# Patient Record
Sex: Female | Born: 1975 | Race: Black or African American | Hispanic: No | Marital: Single | Smoking: Never smoker
Health system: Southern US, Community
[De-identification: ages and names within clinical notes are randomized; demographics above are authoritative.]

## PROBLEM LIST (undated history)

## (undated) DIAGNOSIS — A549 Gonococcal infection, unspecified: Secondary | ICD-10-CM

## (undated) DIAGNOSIS — B009 Herpesviral infection, unspecified: Secondary | ICD-10-CM

## (undated) DIAGNOSIS — F3281 Premenstrual dysphoric disorder: Secondary | ICD-10-CM

## (undated) HISTORY — PX: OTHER SURGICAL HISTORY: SHX169

## (undated) HISTORY — PX: WISDOM TOOTH EXTRACTION: SHX21

## (undated) HISTORY — PX: CHOLECYSTECTOMY: SHX55

## (undated) HISTORY — DX: Premenstrual dysphoric disorder: F32.81

## (undated) HISTORY — PX: MOUTH SURGERY: SHX715

## (undated) HISTORY — DX: Gonococcal infection, unspecified: A54.9

## (undated) HISTORY — DX: Herpesviral infection, unspecified: B00.9

---

## 1997-09-25 ENCOUNTER — Emergency Department (HOSPITAL_COMMUNITY): Admission: EM | Admit: 1997-09-25 | Discharge: 1997-09-25 | Payer: Self-pay | Admitting: Family Medicine

## 1997-11-17 ENCOUNTER — Emergency Department (HOSPITAL_COMMUNITY): Admission: EM | Admit: 1997-11-17 | Discharge: 1997-11-17 | Payer: Self-pay | Admitting: Emergency Medicine

## 1997-12-14 ENCOUNTER — Emergency Department (HOSPITAL_COMMUNITY): Admission: EM | Admit: 1997-12-14 | Discharge: 1997-12-14 | Payer: Self-pay | Admitting: Emergency Medicine

## 1998-03-10 ENCOUNTER — Emergency Department (HOSPITAL_COMMUNITY): Admission: EM | Admit: 1998-03-10 | Discharge: 1998-03-10 | Payer: Self-pay | Admitting: Emergency Medicine

## 1998-07-30 ENCOUNTER — Inpatient Hospital Stay (HOSPITAL_COMMUNITY): Admission: AD | Admit: 1998-07-30 | Discharge: 1998-07-30 | Payer: Self-pay | Admitting: *Deleted

## 1998-08-29 ENCOUNTER — Emergency Department (HOSPITAL_COMMUNITY): Admission: EM | Admit: 1998-08-29 | Discharge: 1998-08-29 | Payer: Self-pay

## 1999-01-11 ENCOUNTER — Emergency Department (HOSPITAL_COMMUNITY): Admission: EM | Admit: 1999-01-11 | Discharge: 1999-01-11 | Payer: Self-pay | Admitting: Emergency Medicine

## 1999-01-11 ENCOUNTER — Encounter: Payer: Self-pay | Admitting: Emergency Medicine

## 2001-10-04 ENCOUNTER — Inpatient Hospital Stay (HOSPITAL_COMMUNITY): Admission: EM | Admit: 2001-10-04 | Discharge: 2001-10-06 | Payer: Self-pay | Admitting: Psychiatry

## 2003-04-22 ENCOUNTER — Emergency Department (HOSPITAL_COMMUNITY): Admission: EM | Admit: 2003-04-22 | Discharge: 2003-04-23 | Payer: Self-pay | Admitting: Emergency Medicine

## 2003-11-15 ENCOUNTER — Other Ambulatory Visit: Admission: RE | Admit: 2003-11-15 | Discharge: 2003-11-15 | Payer: Self-pay | Admitting: Obstetrics and Gynecology

## 2004-01-13 ENCOUNTER — Ambulatory Visit (HOSPITAL_COMMUNITY): Admission: RE | Admit: 2004-01-13 | Discharge: 2004-01-14 | Payer: Self-pay | Admitting: General Surgery

## 2004-01-13 ENCOUNTER — Encounter (INDEPENDENT_AMBULATORY_CARE_PROVIDER_SITE_OTHER): Payer: Self-pay | Admitting: *Deleted

## 2004-11-16 ENCOUNTER — Other Ambulatory Visit: Admission: RE | Admit: 2004-11-16 | Discharge: 2004-11-16 | Payer: Self-pay | Admitting: Obstetrics and Gynecology

## 2005-11-17 ENCOUNTER — Other Ambulatory Visit: Admission: RE | Admit: 2005-11-17 | Discharge: 2005-11-17 | Payer: Self-pay | Admitting: Obstetrics and Gynecology

## 2006-11-01 ENCOUNTER — Encounter (INDEPENDENT_AMBULATORY_CARE_PROVIDER_SITE_OTHER): Payer: Self-pay | Admitting: Nurse Practitioner

## 2006-11-01 ENCOUNTER — Ambulatory Visit: Payer: Self-pay | Admitting: Internal Medicine

## 2006-11-01 ENCOUNTER — Ambulatory Visit: Payer: Self-pay | Admitting: *Deleted

## 2006-11-01 LAB — CONVERTED CEMR LAB
ALT: 12 units/L (ref 0–35)
BUN: 14 mg/dL (ref 6–23)
Basophils Absolute: 0 10*3/uL (ref 0.0–0.1)
CO2: 24 meq/L (ref 19–32)
Calcium: 9 mg/dL (ref 8.4–10.5)
Chloride: 104 meq/L (ref 96–112)
Cholesterol: 190 mg/dL (ref 0–200)
Creatinine, Ser: 0.9 mg/dL (ref 0.40–1.20)
Eosinophils Relative: 1 % (ref 0–5)
Glucose, Bld: 79 mg/dL (ref 70–99)
HCT: 39.6 % (ref 36.0–46.0)
Hemoglobin: 12.1 g/dL (ref 12.0–15.0)
Lymphocytes Relative: 19 % (ref 12–46)
Lymphs Abs: 1.4 10*3/uL (ref 0.7–3.3)
Monocytes Absolute: 0.5 10*3/uL (ref 0.2–0.7)
Monocytes Relative: 6 % (ref 3–11)
Neutro Abs: 5.7 10*3/uL (ref 1.7–7.7)
RBC: 4.37 M/uL (ref 3.87–5.11)
Total CHOL/HDL Ratio: 3
Triglycerides: 63 mg/dL (ref <150)
WBC: 7.7 10*3/uL (ref 4.0–10.5)

## 2006-12-05 ENCOUNTER — Ambulatory Visit: Payer: Self-pay | Admitting: Internal Medicine

## 2007-02-08 ENCOUNTER — Ambulatory Visit: Payer: Self-pay | Admitting: Family Medicine

## 2007-04-28 ENCOUNTER — Ambulatory Visit (HOSPITAL_COMMUNITY): Admission: RE | Admit: 2007-04-28 | Discharge: 2007-04-28 | Payer: Self-pay | Admitting: Nurse Practitioner

## 2007-05-30 ENCOUNTER — Ambulatory Visit: Payer: Self-pay | Admitting: Internal Medicine

## 2007-05-30 ENCOUNTER — Encounter (INDEPENDENT_AMBULATORY_CARE_PROVIDER_SITE_OTHER): Payer: Self-pay | Admitting: Nurse Practitioner

## 2007-05-30 LAB — CONVERTED CEMR LAB
Basophils Absolute: 0 10*3/uL (ref 0.0–0.1)
CO2: 24 meq/L (ref 19–32)
Cholesterol: 199 mg/dL (ref 0–200)
Creatinine, Ser: 0.9 mg/dL (ref 0.40–1.20)
Eosinophils Absolute: 0.1 10*3/uL (ref 0.0–0.7)
Eosinophils Relative: 1 % (ref 0–5)
Glucose, Bld: 72 mg/dL (ref 70–99)
HCT: 40.1 % (ref 36.0–46.0)
Hemoglobin: 12.8 g/dL (ref 12.0–15.0)
Lymphocytes Relative: 19 % (ref 12–46)
Lymphs Abs: 1.9 10*3/uL (ref 0.7–4.0)
MCV: 89.3 fL (ref 78.0–100.0)
Monocytes Absolute: 0.6 10*3/uL (ref 0.1–1.0)
RDW: 13.5 % (ref 11.5–15.5)
Total Bilirubin: 0.4 mg/dL (ref 0.3–1.2)
Total CHOL/HDL Ratio: 2.7
Triglycerides: 103 mg/dL (ref <150)
VLDL: 21 mg/dL (ref 0–40)

## 2007-07-26 ENCOUNTER — Ambulatory Visit: Payer: Self-pay | Admitting: Family Medicine

## 2007-07-26 ENCOUNTER — Encounter (INDEPENDENT_AMBULATORY_CARE_PROVIDER_SITE_OTHER): Payer: Self-pay | Admitting: Nurse Practitioner

## 2008-03-22 ENCOUNTER — Ambulatory Visit: Payer: Self-pay | Admitting: Internal Medicine

## 2008-03-22 ENCOUNTER — Encounter (INDEPENDENT_AMBULATORY_CARE_PROVIDER_SITE_OTHER): Payer: Self-pay | Admitting: Internal Medicine

## 2008-03-22 LAB — CONVERTED CEMR LAB
ALT: 9 units/L (ref 0–35)
BUN: 14 mg/dL (ref 6–23)
CO2: 23 meq/L (ref 19–32)
Calcium: 9.3 mg/dL (ref 8.4–10.5)
Chloride: 104 meq/L (ref 96–112)
Creatinine, Ser: 0.8 mg/dL (ref 0.40–1.20)
GC Probe Amp, Genital: NEGATIVE
Glucose, Bld: 86 mg/dL (ref 70–99)

## 2008-06-15 ENCOUNTER — Emergency Department (HOSPITAL_COMMUNITY): Admission: EM | Admit: 2008-06-15 | Discharge: 2008-06-15 | Payer: Self-pay | Admitting: Emergency Medicine

## 2008-06-24 ENCOUNTER — Ambulatory Visit: Payer: Self-pay | Admitting: Internal Medicine

## 2008-06-24 ENCOUNTER — Encounter: Payer: Self-pay | Admitting: Internal Medicine

## 2008-06-24 DIAGNOSIS — R21 Rash and other nonspecific skin eruption: Secondary | ICD-10-CM | POA: Insufficient documentation

## 2008-07-16 ENCOUNTER — Ambulatory Visit: Payer: Self-pay | Admitting: Internal Medicine

## 2008-07-19 ENCOUNTER — Ambulatory Visit: Payer: Self-pay | Admitting: Internal Medicine

## 2008-08-21 ENCOUNTER — Ambulatory Visit: Payer: Self-pay | Admitting: Internal Medicine

## 2008-09-13 ENCOUNTER — Ambulatory Visit: Payer: Self-pay | Admitting: Internal Medicine

## 2008-09-17 ENCOUNTER — Ambulatory Visit: Payer: Self-pay | Admitting: Internal Medicine

## 2008-09-19 ENCOUNTER — Ambulatory Visit: Payer: Self-pay | Admitting: Internal Medicine

## 2008-09-29 ENCOUNTER — Emergency Department (HOSPITAL_COMMUNITY): Admission: EM | Admit: 2008-09-29 | Discharge: 2008-09-29 | Payer: Self-pay | Admitting: Emergency Medicine

## 2008-10-11 ENCOUNTER — Emergency Department (HOSPITAL_COMMUNITY): Admission: EM | Admit: 2008-10-11 | Discharge: 2008-10-11 | Payer: Self-pay | Admitting: Family Medicine

## 2008-10-16 ENCOUNTER — Telehealth (INDEPENDENT_AMBULATORY_CARE_PROVIDER_SITE_OTHER): Payer: Self-pay | Admitting: *Deleted

## 2008-10-17 ENCOUNTER — Ambulatory Visit: Payer: Self-pay | Admitting: Obstetrics and Gynecology

## 2008-11-21 ENCOUNTER — Ambulatory Visit: Payer: Self-pay | Admitting: Internal Medicine

## 2008-11-21 ENCOUNTER — Telehealth (INDEPENDENT_AMBULATORY_CARE_PROVIDER_SITE_OTHER): Payer: Self-pay | Admitting: *Deleted

## 2009-01-17 ENCOUNTER — Ambulatory Visit: Payer: Self-pay | Admitting: Internal Medicine

## 2009-01-17 ENCOUNTER — Encounter (INDEPENDENT_AMBULATORY_CARE_PROVIDER_SITE_OTHER): Payer: Self-pay | Admitting: Internal Medicine

## 2009-01-17 LAB — CONVERTED CEMR LAB

## 2010-05-28 ENCOUNTER — Inpatient Hospital Stay (HOSPITAL_COMMUNITY): Admission: RE | Admit: 2010-05-28 | Payer: Managed Care, Other (non HMO) | Source: Ambulatory Visit

## 2010-07-14 NOTE — Group Therapy Note (Signed)
NAME:  [name], [name] NO.:  [id number]   MEDICAL RECORD NO.:  [id number]          PATIENT TYPE:  WOC   LOCATION:  [facility]                   FACILITY:  [facility]   PHYSICIAN:  Argentina Donovan, MD        DATE OF BIRTH:  [birth year]   DATE OF SERVICE:  10/17/2008                                  CLINIC NOTE   OFFICE VISIT:  At [facility].   PHYSICAL EXAMINATION:  VITAL SIGNS:  Temperature 98.8, pulse 81, blood  pressure 128/77, weight 294.4 pounds, and height 5 feet 7 inches.   REASON FOR VISIT:  The patient was referred by [facility] for a  possible cervical polyp.   The patient was seen at [facility] on March 22, 2008.  At that time,  a possible polyp was seen on the cervix at 12 o'clock.  The patient was  referred here for further evaluation.  She had a Pap smear on Jul 19, 2008, that was negative for intraepithelial lesion or malignancy.  Here  she has no complaint of pain with intercourse or vaginal discharge.  Menses have been regular, lasting usually for 5 days and heavy.  The  patient just started on Desogen birth control pill in July and her last  menstrual period is today, October 17, 2008 she just started her period.  She is sexually active with 1 partner since May 2010 and they are using  condoms consistently.   On exam, her cervix looks normal without any redness or inflammation.  She is on her period and there is some blood seen on exam.  Of note, at  12 o'clock position, there is a flesh-colored lesion that may be  considered a polyp or a sort of cervical lesion that looks benign.  This  area is not tender to palpation, is not friable, does not bleed on  palpation.  At this time, we have determined that the lesions are benign  and does not need further workup unless she starts to have some sort of  complaints for this.  Given that she did have a normal Pap smear done 3  months ago, we feel comfortable that this is a benign lesion.   The  patient is instructed to return to [facility] for further concerns or  she can call the [facility] here with further concerns.      Sid Falcon, CNM    ______________________________  Argentina Donovan, MD    WM/MEDQ  D:  10/17/2008  T:  10/18/2008  Job:  [id number]

## 2010-07-17 NOTE — Discharge Summary (Signed)
NAMENORELLE, RUNNION                ACCOUNT NO.:  [id number]   MEDICAL RECORD NO.:  [id number]          PATIENT TYPE:  OIB   LOCATION:  5704                         FACILITY:  [facility]   PHYSICIAN:  Gabrielle Dare. Janee Morn, M.D.DATE OF BIRTH:  1975/05/05   DATE OF ADMISSION:  01/13/2004  DATE OF DISCHARGE:  01/14/2004                                 DISCHARGE SUMMARY   DISCHARGE DIAGNOSES:  1.  Symptomatic cholelithiasis.  2.  Status post laparoscopic cholecystectomy with intraoperative      cholangiogram.   HISTORY OF PRESENT ILLNESS:  The patient is a 35 year old African American  female with symptomatic cholelithiasis who presented for elective  cholecystectomy.   HOSPITAL COURSE:  The patient underwent an uncomplicated laparoscopic  cholecystectomy with intraoperative cholangiogram.  Her cholangiogram  demonstrated no filling defects in the common bile duct.  Her common bile  duct was generous in size.  Postoperatively, she tolerated gradual  advancement of her diet.  She remained afebrile and hemodynamically stable  and had good pain control, and she is discharged home in stable condition on  postoperative day #1.   DISCHARGE INSTRUCTIONS:  1.  Diet:  Low-fat.  2.  Activity:  No lifting.   DISCHARGE MEDICATIONS:  1.  Percocet 5/325 1-2 q.6h. p.r.n. pain.  2.  Continue outpatient medications including:      1.  Phentermine 15 mg p.o. daily.      2.  Lasix 40 mg p.o. daily.   FOLLOWUP:  Follow up is in 3 weeks with myself.       BET/MEDQ  D:  01/14/2004  T:  01/14/2004  Job:  [id number]

## 2010-07-17 NOTE — H&P (Signed)
   [name], [name]                          ACCOUNT NO.:  [id number]   MEDICAL RECORD NO.:  [id number]                   PATIENT TYPE:  IPS   LOCATION:  0504                                 [facility]ILITY[facility]   PHYSICIAN:  Jeanice Lim, MD                DATE OF BIRTH:  10-09-1975   DATE OF ADMISSION:  10/04/2001  DATE OF DISCHARGE:  10/06/2001                         PSYCHIATRIC ADMISSION ASSESSMENT   IDENTIFYING INFORMATION:  This is a 35 year old African-American female, who  is a voluntary admission.   HISTORY OF PRESENT ILLNESS:  This patient drove himself to [facility] on her way home from work after a crisis with her  boyfriend coworker, who was emailing another woman.  The patient became  agitated, upset and was feeling betrayed by the boyfriend.  She called a  Veterinary surgeon, who suggested that she come here for assessment.  She has been  feeling angry with her boyfriend and talked of whipping up on one of them  but denies that she ever had any true homicidal intent or any suicidal  intent.  She denies any symptoms of depression.   Dictation ended at this point.     [name]                   Jeanice Lim, MD    MAS/MEDQ  D:  11/14/2001  T:  11/15/2001  Job:  [id number]

## 2010-07-17 NOTE — H&P (Signed)
[name], [name]                          ACCOUNT NO.:  [id number]   MEDICAL RECORD NO.:  [id number]                   PATIENT TYPE:  IPS   LOCATION:  0504                                 [facility]   PHYSICIAN:  Viviann Spare, NP                DATE OF BIRTH:  Jul 02, 1975   DATE OF ADMISSION:  10/04/2001  DATE OF DISCHARGE:                         PSYCHIATRIC ADMISSION ASSESSMENT   DATE OF ASSESSMENT:  October 05, 2001.   IDENTIFYING INFORMATION:  This is a 35 year old African-American female who  is a voluntary admission.  Her chief complaint this is a terrible mistake.  I only came here for counseling.  I didn't think I was going to spend the  night.   HISTORY OF PRESENT ILLNESS:  This patient drove herself to [facility] on the way home from work after having a crisis  with her boyfriend coworker, who she found emailing another female employee.  The patient became agitated, upset and was feeling betrayed by the  boyfriend.  They had a relationship, which she considered close after two  years of progressively a more intimate relationship.  She feels that he has  betrayed her, that he is really interested in another woman.  The patient  called her psychotherapist, who suggested that, because she was upset, she  may need to come to [facility].  The patient has admitted, at  the time of admission, to feeling angry at her boyfriend and the other  woman, both of whom are her coworkers and talked of whipping up on one of  them and possibly harming them.  She was not able to promise safety for her  homicidal ideation at the time of admission.  Today, she adamantly denies  any suicidal ideation or homicidal ideation.  She denies any symptoms of  depression.   PAST PSYCHIATRIC HISTORY:  The patient has no history of prior treatment.  She denies any history of violence or temper outbursts or mood swings.  She  has no prior history of  suicide attempts as far as we can tell or substance  abuse.  This is her first psychiatric admission.   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  She was originally from [place], [place] and was  raised the younger of two children.  She reports her grades are  satisfactory.  No history of behavior problems or learning disabilities  coming up through school.  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  She is never  married.  She has no children.  She denies any prior legal problems or  financial problems.   FAMILY HISTORY:  The patient denies.   ALCOHOL/DRUG HISTORY:  The patient denies any current or past issues with  substance abuse.  She will rarely  drink a beer.   MEDICAL HISTORY:  The patient is followed by her OB/GYN physician only for  routine well-woman checks and medical problems are none.   MEDICATIONS:  Ortho Tri-Cyclen oral contraceptive tablets.   ALLERGIES:  None.   POSITIVE PHYSICAL FINDINGS:  The patient has refused a physical examination  at this point.  She declines because she wants to go make some phone calls.  We will try her again a little bit later to do a full physical.  She  generally is healthy in appearance.  In no acute distress.  No somatic  complaints.  Healthy-appearing, however obese, 35 year old female who  appears to be her stated age.  Vital signs, on admission, include  temperature 97.2, pulse 80, respirations 20, blood pressure 130/78.  She is  5 feet 7 inches tall and was 296 pounds.  She states this is a normal weight  for her.   LABORATORY DATA:  Electrolytes within normal limits.  Her albumin is mildly  decreased at 3.3, BUN 12, creatinine 0.9.  Liver enzymes within normal  limits.  AST 17, ALT 16.  CBC is within normal limits with hemoglobin 12.1,  hematocrit 36.4, MCV 83.8 and platelets 359,000.  The patient's thyroid   panel is currently pending as is a UA and a urine drug screen and urine  pregnancy test.   MENTAL STATUS EXAM:  This is an obese, young female who is fully alert.  She  is in no acute distress with an appropriate affect.  She does have a  slightly histrionic manner and she is mildly irritable about the fact that  she has been admitted and she feels that she was somewhat misled and this is  interfering with her scheduled activities for the week.  She is generally  cooperative and polite.  Speech is normal.  Mood is frustrated.  Thought  process is logical, goal directed.  No evidence of suicidal ideation.  No  homicidal ideation.  No evidence of paranoia, agitation, mania or guarding.  No psychosis.  Cognitively, she is intact and oriented x 3.  Intelligence  average to above average.  Insight fair to good.  Impulse control within  normal limits.  Judgment within normal limits.  The patient is frustrated  and had indicated a wish to harm this coworker without any specific plan out  of frustration but, on close questioning, she has no specific plan and truly  admits that she does not want harm to come to these individuals but she is  just very frustrated with the fact that she feels let down and betrayed by  her boyfriend.  She feels somewhat guilty about her statements yesterday and  self-conscious about her temper outbursts.   DIAGNOSES:   AXIS I:  1. Adjustment reaction not otherwise specified.  2. Rule out mood disorder.   AXIS II:  Deferred.   AXIS III:  None.   AXIS IV:  Moderate (stress being conflict with her boyfriend).   AXIS V:  Current 35; past year 35   PLAN:  Voluntarily admit the patient to evaluate some questionable homicidal  ideation since she did make some specific threats about wishing that she  could harm a Radio broadcast assistant.  The patient is declining medications at this time. We have discussed the pros and cons of medications and have also encouraged  her to  continue psychotherapy with her current therapist, Areta Haber,  and we will plan for follow-up with this.  We have initially recommended  Lexapro 5 mg for her.  However, she does not feel she needs medications and  we are in agreement that we will continue to observe her at this point.   ESTIMATED LENGTH OF STAY:  One to two days.                                               Viviann Spare, NP   MAS/MEDQ  D:  10/06/2001  T:  10/13/2001  Job:  [id number]

## 2010-07-17 NOTE — Discharge Summary (Signed)
NAMESHAMONE, [name]                          ACCOUNT NO.:  [id number]   MEDICAL RECORD NO.:  [id number]                   PATIENT TYPE:  IPS   LOCATION:  0504                                 [facility]ILITY[facility]   PHYSICIAN:  Jeanice Lim, MD                DATE OF BIRTH:  07-06-1975   DATE OF ADMISSION:  10/04/2001  DATE OF DISCHARGE:  10/06/2001                                 DISCHARGE SUMMARY   IDENTIFYING DATA:  This is a 35 year old African-American female,  voluntarily admitted feeling that it was a terrible mistake coming here.  She had meant to get counseling.  Feeling angry at her boyfriend and another  woman, one of her co-workers, had talked about whipping up on them, but  denied any homicidal ideation or uncontrollable violent impulses.  The  patient had no significant psych history.   ADMISSION MEDICATIONS:  Ortho Tri-Cyclen.   ALLERGIES:  No known drug allergies.   PHYSICAL EXAMINATION:  Essentially within normal limits, neurologically  nonfocal.   ROUTINE ADMISSION LABS:  Within normal limits.  Urine drug screen and  pregnancy test negative.   MENTAL STATUS EXAM:  Obese young female, fully alert, in no acute distress,  slightly histrionic, mildly irritable, generally cooperative and polite.  Speech normal, mood frustrated, thought process goal directed, thought  content negative for dangerous ideations or psychotic symptoms.  Cognition  intact.  Judgment and insight fair to good.   ADMISSION DIAGNOSES:   AXIS I:  1. 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  2. Depression not otherwise specified.   AXIS II:  None.   AXIS III:  None.   AXIS IV:  Moderate, problems related to conflict with boyfriend.   AXIS V:  26/78.   HOSPITAL COURSE:  The patient was admitted and ordered routine p.r.n.  medications, underwent further monitoring, was evaluated by psychiatrist.  Following psychiatric assessment, patient reported no acute dangerous  ideations or  clear indication for inpatient treatment.  There were no acute  risk issues.  Therefore she was discharged.   CONDITION ON DISCHARGE:  Improved.  Mood was more euthymic, affect brighter,  thought process goal directed.  Thought content negative for dangerous  ideation or psychotic symptoms.  The patient reported insight into realizing  the seriousness of making threats and felt that she could be compliant with  a followup plan and deal with stress in a more healthy manner.   DISCHARGE MEDICATIONS:  The patient was to continue medications prescribed  prior to admission, no psychotropics were ordered at that time.   DISPOSITION:  Scheduled to followup with Areta Haber, Tuesday, August 12  at 2:30.   DISCHARGE DIAGNOSES:   AXIS II:  None.   AXIS III:  None.   AXIS IV:  Moderate, problems related to conflict with boyfriend.   AXIS V:  Global assessment of function on discharge was 55.  Jeanice Lim, MD    JEM/MEDQ  D:  11/09/2001  T:  11/10/2001  Job:  [id number]

## 2010-07-17 NOTE — Op Note (Signed)
[name], [name]                ACCOUNT NO.:  [id number]   MEDICAL RECORD NO.:  [id number]          PATIENT TYPE:  OIB   LOCATION:  2550                         FACILITY:  [facility]   PHYSICIAN:  Gabrielle Dare. Janee Morn, M.D.DATE OF BIRTH:  08/12/1975   DATE OF PROCEDURE:  01/13/2004  DATE OF DISCHARGE:                                 OPERATIVE REPORT   PREOPERATIVE DIAGNOSIS:  Symptomatic cholelithiasis.   POSTOPERATIVE DIAGNOSIS:  Symptomatic cholelithiasis.   PROCEDURE:  Laparoscopic cholecystectomy with interoperative cholangiogram.   SURGEON:  Gabrielle Dare. Janee Morn, M.D.   ASSISTANT:  Abigail Miyamoto, M.D.   ANESTHESIA:  General.   HISTORY OF PRESENT ILLNESS:  The patient is a 35 year old African American  female with a history of morbid obesity who was initially evaluated earlier  this year by Dr. Magnus Ivan from our practice.  She had delayed surgery for  elective cholecystectomy but represented to be scheduled after some  recurrent attacks over the past couple of weeks.  She now presents for  elective cholecystectomy.   PROCEDURE IN DETAIL:  Informed consent was obtained.  The patient received  intravenous antibiotics.  She was brought to the operating room and general  anesthesia was administered.  Her abdomen was prepped and draped in a  sterile fashion.  An infraumbilical incision was made.  Subcutaneous tissues  were dissected down revealing the anterior fascia which was divided sharply.  The peritoneal cavity was entered under direct vision without difficulty.  A  0 Vicryl purse-string suture was placed around the fascial opening.  The  Hasson trocar was inserted into the abdomen.  The abdomen was insufflated  with carbon dioxide in standard fashion.  Under direct vision, an 11 mm  epigastric and two 5 mm lateral ports were placed.  0.25% Marcaine with  epinephrine was used at all port sites.  The dome of the gallbladder was  retracted superomedially and the infundibulum was  retracted inferolaterally.  There was some evidence of inflammation in the tissues.  The cystic duct was  easily identified and dissection was begun laterally and progressed  medially.  The common bile duct was also plainly visible.  We avoided that.  The cystic duct was dissected at the infundibulum cystic duct junction and  the cystic artery was along side it.  This was also circumferentially  dissected.  Two clips were placed proximal on the cystic artery and one  distally and it was divided.  Further dissection created a large window  between the infundibulum of the gallbladder, cystic duct, and the liver.  Once this was accomplished, a clip was placed on the infundibulum cystic  duct junction.  A small nick was made in the cystic duct.  A Reddick  cholangiogram catheter was inserted.  An interoperative cholangiogram was  obtained and this demonstrated a generous size common bile duct, no filling  defects were seen, and the cystic duct as visualized was rather short.  The  cholangiogram was completed and the catheter was removed.  Two clips were  placed proximally on the cystic duct and it was divided.  The gallbladder  was taken off the liver bed with the Bovie cautery.  Several areas were  cauterized to obtain excellent hemostasis.  The gallbladder was quite  adherent to the liver bed, likely due to some chronic inflammation.  The  gallbladder was placed in an endocatch bag and removed via the umbilical  port site.  The abdomen was copiously irrigated with saline.  The liver bed  was rechecked and hemostasis was insured with the Bovie cautery.  Once this  was accomplished, the remainder of the irrigation fluid was evacuated and it  was clear.  The ports were removed under direct vision.  The  pneumoperitoneum was released.  The Hasson trocar was removed.  The  umbilical fascia was closed by tying the 0 Vicryl purse-string suture.  All  four wounds were copiously irrigated.  Some  additional local anesthetic was  injected and the skin of each was closed with a running 4-0 Monocryl  subcuticular stitch.  Sponge, needle, and instrument counts were correct.  Benzoin, Steri-Strips, and sterile dressings were applied.  The patient  tolerated the procedure well without apparent complications.  She was taken  to the recovery room in stable condition.       BET/MEDQ  D:  01/13/2004  T:  01/13/2004  Job:  [id number]

## 2010-09-23 IMAGING — CR DG ANKLE COMPLETE 3+V*R*
3 series · 3 of 3 positions shown · non-contrast
Comparison: None

CLINICAL DATA: Right ankle pain post twisted ankle

RIGHT ANKLE - COMPLETE 3+ VIEW

[view not recorded (1 of 3)]
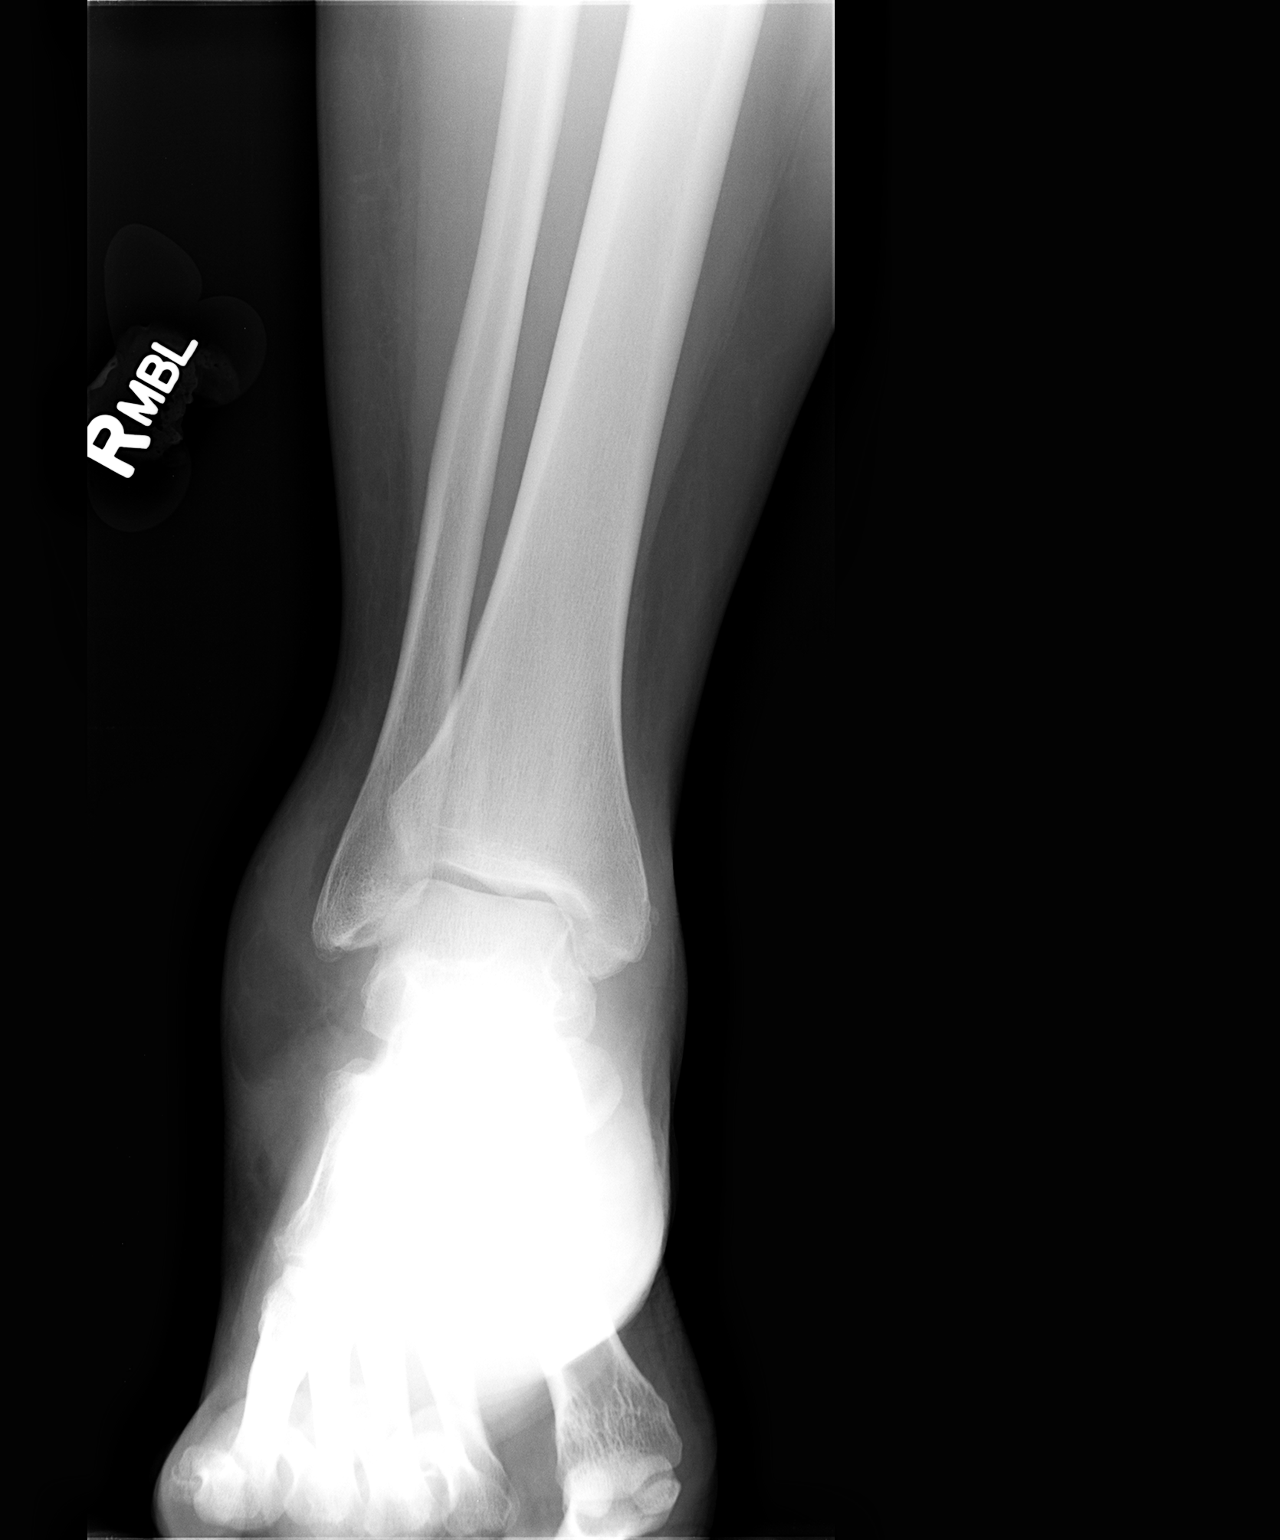

[view not recorded (2 of 3)]
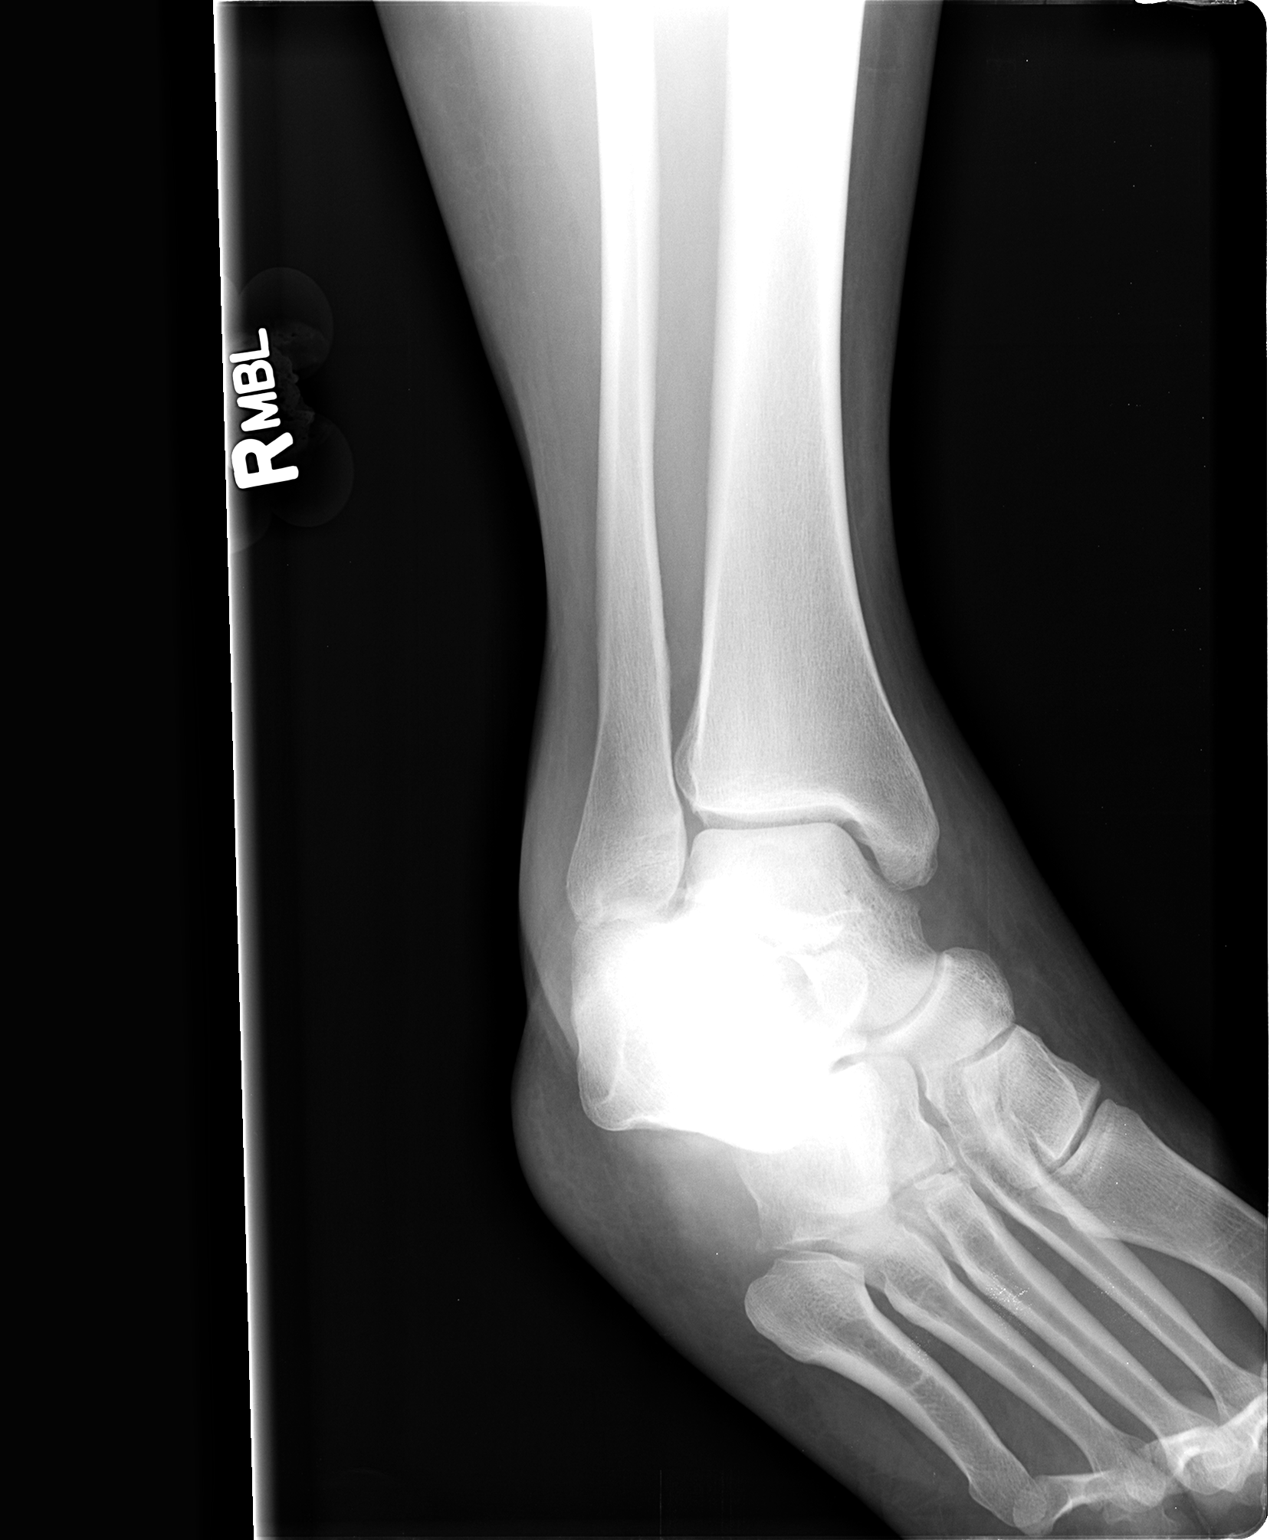

[view not recorded (3 of 3)]
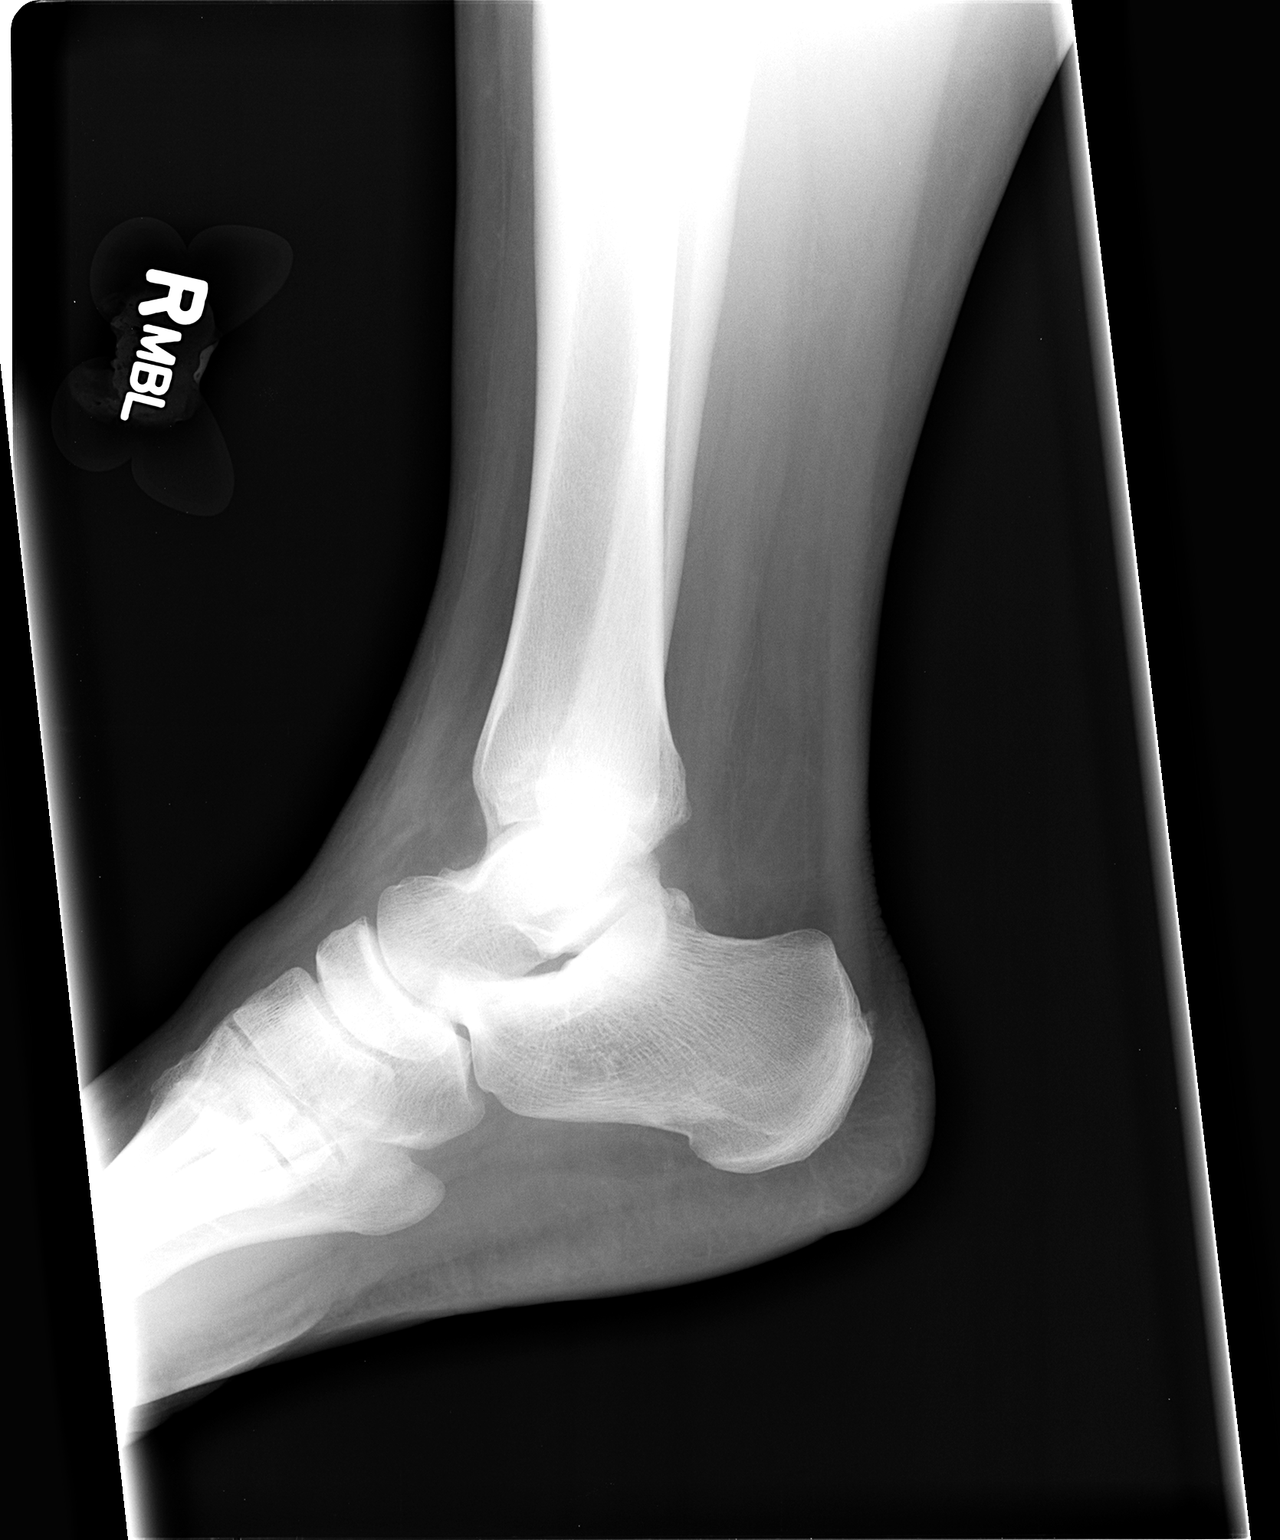

[3 of 3 positions shown; findings below may reference images not displayed]

FINDINGS: Three views of the right ankle submitted.  No acute
fracture or subluxation.  Soft tissue swelling noted adjacent to
lateral malleolus.  Ankle mortise is preserved.
IMPRESSION: No acute fracture or subluxation.  Soft tissue swelling laterally.

## 2012-01-18 ENCOUNTER — Ambulatory Visit (INDEPENDENT_AMBULATORY_CARE_PROVIDER_SITE_OTHER): Payer: BC Managed Care – PPO | Admitting: Obstetrics and Gynecology

## 2012-01-18 ENCOUNTER — Encounter: Payer: Self-pay | Admitting: Obstetrics and Gynecology

## 2012-01-18 VITALS — BP 110/74 | HR 78 | Ht 67.0 in | Wt 314.0 lb

## 2012-01-18 DIAGNOSIS — Z113 Encounter for screening for infections with a predominantly sexual mode of transmission: Secondary | ICD-10-CM

## 2012-01-18 DIAGNOSIS — IMO0001 Reserved for inherently not codable concepts without codable children: Secondary | ICD-10-CM

## 2012-01-18 DIAGNOSIS — Z309 Encounter for contraceptive management, unspecified: Secondary | ICD-10-CM

## 2012-01-18 DIAGNOSIS — Z01419 Encounter for gynecological examination (general) (routine) without abnormal findings: Secondary | ICD-10-CM

## 2012-01-18 DIAGNOSIS — Z124 Encounter for screening for malignant neoplasm of cervix: Secondary | ICD-10-CM

## 2012-01-18 LAB — HIV ANTIBODY (ROUTINE TESTING W REFLEX): HIV: NONREACTIVE

## 2012-01-18 LAB — HEPATITIS C ANTIBODY: HCV Ab: NEGATIVE

## 2012-01-18 LAB — HEPATITIS B SURFACE ANTIGEN: Hepatitis B Surface Ag: NEGATIVE

## 2012-01-18 MED ORDER — NORELGESTROMIN-ETH ESTRADIOL 150-35 MCG/24HR TD PTWK: 1.0000 | MEDICATED_PATCH | TRANSDERMAL | Status: DC

## 2012-01-18 MED ORDER — FLUCONAZOLE 150 MG PO TABS: 150.0000 mg | ORAL_TABLET | Freq: Once | ORAL | Status: AC

## 2012-01-18 NOTE — Progress Notes (Signed)
Regular Periods: yes Mammogram: no  Monthly Breast Ex.: yes Exercise: yes  Tetanus < 10 years: no Seatbelts: yes  NI. Bladder Functn.: yes Abuse at home: no  Daily BM's: no Stressful Work: yes  Healthy Diet: yes Sigmoid-Colonoscopy: no  Calcium: no Medical problems this year: discuss birth control; pill pt was on before she thinks caused her to have facial hair and breakouts on face    LAST PAP:10/12  nl  Contraception: condoms  Mammogram:  no  PCP: no  PMH: no change  FMH: no change  Last Bone Scan: no  PT IS SINGLE

## 2012-01-18 NOTE — Progress Notes (Signed)
Subjective:    KAREN HUHTA is a 36 y.o. female, G1P0, who presents for an annual exam. The patient is interested in some type of contraception and wants to discuss. Also requests STD testing.  Menstrual cycle:   LMP: Patient's last menstrual period was 12/31/2011. Flow 6 days with pad change 5 times a day with cramps rated 8/10 on a 10 point scale relieved with Ibuprofen 400 mg             Review of Systems Pertinent items are noted in HPI. Denies pelvic pain, urinary tract symptoms, vaginitis symptoms, irregular bleeding, menopausal symptoms, change in bowel habits or rectal bleeding   Objective:    BP 110/74  Pulse 78  Ht 5\' 7"  (1.702 m)  Wt 314 lb (142.429 kg)  BMI 49.18 kg/m2  LMP 12/31/2011     Wt Readings from Last 1 Encounters:  01/18/12 314 lb (142.429 kg)   Body mass index is 49.18 kg/(m^2). General Appearance: Alert, no acute distress HEENT: Grossly normal Neck / Thyroid: Supple, no thyromegaly or cervical adenopathy Lungs: Clear to auscultation bilaterally Back: No CVA tenderness Breast Exam: No masses or nodes.No dimpling, nipple retraction or discharge. Cardiovascular: Regular rate and rhythm.  Gastrointestinal: Soft, non-tender, no masses or organomegaly Pelvic Exam: EGBUS-wnl, vagina-normal rugae, cervix- without lesions or tenderness, uterus appears normal size shape and consistency, adnexae-no masses or tenderness Rectovaginal: no masses and normal sphincter tone Lymphatic Exam: Non-palpable nodes in neck, clavicular,  axillary, or inguinal regions  Skin: no rashes or abnormalities Extremities: no clubbing cyanosis or edema  Neurologic: grossly normal Psychiatric: Alert and oriented  UPT:    Assessment:   Routine GYN Exam   Plan:  Handout on Contraceptive Methods given and reviewed  STD testing  Ortho Evra Patch  #1 apply as directed 3 of 4 weeks 11 refills  Reviewed Ortho Evra at patient's request: effectiveness, MOA, side effects risks to  include VTE events, skin considerations and use/dosing  Ortho Evra  #1 apply as directed weekly 3 of 4 11 refills Ortho Evra website information given  PAP sent  RTO 1 year or prn  Pariss Hommes,[email or web address]

## 2012-01-18 NOTE — Patient Instructions (Addendum)
[email or web address]   Other pills less likely to cause facial hair:  Estrostep, Safyral and [employer]

## 2012-01-20 LAB — PAP IG, CT-NG, RFX HPV ASCU: GC Probe Amp: NEGATIVE

## 2012-01-25 ENCOUNTER — Ambulatory Visit: Payer: Self-pay | Admitting: Obstetrics and Gynecology

## 2013-12-31 ENCOUNTER — Encounter: Payer: Self-pay | Admitting: Obstetrics and Gynecology

## 2018-05-29 ENCOUNTER — Emergency Department (HOSPITAL_COMMUNITY): Payer: Managed Care, Other (non HMO)

## 2018-05-29 ENCOUNTER — Encounter (HOSPITAL_COMMUNITY): Payer: Self-pay | Admitting: Emergency Medicine

## 2018-05-29 ENCOUNTER — Emergency Department (HOSPITAL_COMMUNITY)
Admission: EM | Admit: 2018-05-29 | Discharge: 2018-05-30 | Disposition: A | Discharge status: Home / Self Care | Payer: Managed Care, Other (non HMO) | Attending: Emergency Medicine | Admitting: Emergency Medicine

## 2018-05-29 ENCOUNTER — Other Ambulatory Visit: Payer: Self-pay

## 2018-05-29 DIAGNOSIS — Z9049 Acquired absence of other specified parts of digestive tract: Secondary | ICD-10-CM | POA: Insufficient documentation

## 2018-05-29 DIAGNOSIS — Z3A14 14 weeks gestation of pregnancy: Secondary | ICD-10-CM | POA: Diagnosis not present

## 2018-05-29 DIAGNOSIS — O209 Hemorrhage in early pregnancy, unspecified: Secondary | ICD-10-CM | POA: Diagnosis present

## 2018-05-29 DIAGNOSIS — Z7689 Persons encountering health services in other specified circumstances: Secondary | ICD-10-CM

## 2018-05-29 LAB — CBC WITH DIFFERENTIAL/PLATELET
Abs Immature Granulocytes: 0.02 10*3/uL (ref 0.00–0.07)
Basophils Absolute: 0 10*3/uL (ref 0.0–0.1)
Basophils Relative: 0 %
EOS ABS: 0.1 10*3/uL (ref 0.0–0.5)
Eosinophils Relative: 1 %
HCT: 35.2 % — ABNORMAL LOW (ref 36.0–46.0)
Hemoglobin: 10.6 g/dL — ABNORMAL LOW (ref 12.0–15.0)
Immature Granulocytes: 0 %
Lymphocytes Relative: 14 %
Lymphs Abs: 1.4 10*3/uL (ref 0.7–4.0)
MCH: 25.2 pg — ABNORMAL LOW (ref 26.0–34.0)
MCHC: 30.1 g/dL (ref 30.0–36.0)
MCV: 83.8 fL (ref 80.0–100.0)
Monocytes Absolute: 0.7 10*3/uL (ref 0.1–1.0)
Monocytes Relative: 7 %
Neutro Abs: 7.6 10*3/uL (ref 1.7–7.7)
Neutrophils Relative %: 78 %
Platelets: 279 10*3/uL (ref 150–400)
RBC: 4.2 MIL/uL (ref 3.87–5.11)
RDW: 15.3 % (ref 11.5–15.5)
WBC: 9.7 10*3/uL (ref 4.0–10.5)
nRBC: 0 % (ref 0.0–0.2)

## 2018-05-29 LAB — URINALYSIS, ROUTINE W REFLEX MICROSCOPIC
BILIRUBIN URINE: NEGATIVE
Glucose, UA: NEGATIVE mg/dL
Ketones, ur: NEGATIVE mg/dL
Nitrite: NEGATIVE
Protein, ur: NEGATIVE mg/dL
RBC / HPF: >50 RBC/hpf — ABNORMAL HIGH (ref 0–5)
Specific Gravity, Urine: 1.018 (ref 1.005–1.030)
pH: 5 (ref 5.0–8.0)

## 2018-05-29 LAB — BASIC METABOLIC PANEL
Anion gap: 8 (ref 5–15)
BUN: 10 mg/dL (ref 6–20)
CALCIUM: 9 mg/dL (ref 8.9–10.3)
CO2: 22 mmol/L (ref 22–32)
Chloride: 105 mmol/L (ref 98–111)
Creatinine, Ser: 0.67 mg/dL (ref 0.44–1.00)
GFR calc non Af Amer: >60 mL/min (ref >60)
Glucose, Bld: 94 mg/dL (ref 70–99)
Potassium: 3.9 mmol/L (ref 3.5–5.1)
Sodium: 135 mmol/L (ref 135–145)

## 2018-05-29 MED ORDER — SODIUM CHLORIDE 0.9 % IV BOLUS
1000.0000 mL | Freq: Once | INTRAVENOUS | Status: AC
  Administered 2018-05-29: 1000 mL via INTRAVENOUS

## 2018-05-29 MED ORDER — FENTANYL CITRATE (PF) 100 MCG/2ML IJ SOLN
50.0000 ug | Freq: Once | INTRAMUSCULAR | Status: DC
  Filled 2018-05-29: qty 2

## 2018-05-29 MED ORDER — IBUPROFEN 200 MG PO TABS
600.0000 mg | ORAL_TABLET | Freq: Once | ORAL | Status: AC
  Administered 2018-05-30: 600 mg via ORAL
  Filled 2018-05-29: qty 3

## 2018-05-29 NOTE — ED Notes (Signed)
Pelvic cart set up at bedside  

## 2018-05-29 NOTE — ED Notes (Signed)
Pt resting in bed. IV placed, Labs drawn, fluids running. Pt in NAD

## 2018-05-29 NOTE — ED Provider Notes (Signed)
[facility]-EMERGENCY DEPT Provider Note   CSN: [id number] Arrival date & time: 05/29/18  1930    History   Chief Complaint Chief Complaint  Patient presents with  . Vaginal Bleeding    HPI [name] is a 43 y.o. female.     HPI Patient presents with vaginal bleeding and lower abdominal pain.  She is 14 weeks and 2 days pregnant.  Has been managed at [facility].  She is visiting her boyfriend in [place].  States she also has passed vaginal blood.  She has crampy abdominal pain.  She is not had a baby previously so she does not know what contractions feel like.  Has not had fevers.  Pain comes and goes.  No dysuria.  No blood in the urine.  No diarrhea or constipation.  Blood type is a positive. Past Medical History:  Diagnosis Date  . Gonorrhea    h/o  . HSV-2 infection   . PMDD (premenstrual dysphoric disorder)     Patient Active Problem List   Diagnosis Date Noted  . SKIN RASH 06/24/2008    Past Surgical History:  Procedure Laterality Date  . CHOLECYSTECTOMY    . MOUTH SURGERY     root tip removal  . oral surg    . WISDOM TOOTH EXTRACTION       OB History    Gravida  1   Para      Term      Preterm      AB      Living  0     SAB      TAB      Ectopic      Multiple      Live Births               Home Medications    Prior to Admission medications   Medication Sig Start Date End Date Taking? Authorizing Provider  docusate sodium (COLACE) 100 MG capsule Take 200 mg by mouth daily as needed for mild constipation.   Yes [provider]  Prenatal 28-0.8 MG TABS Take 1 tablet by mouth daily.   Yes [provider]  valACYclovir (VALTREX) 500 MG tablet Take 500 mg by mouth 2 (two) times daily as needed (outbreak).    Yes [provider]  norelgestromin-ethinyl estradiol (ORTHO EVRA) 150-20 MCG/24HR transdermal patch Place 1 patch onto the skin once a week. apply as directed weekly 3 of 4 weeks  Patient not taking: Reported on 05/29/2018 01/18/12   Henreitta Leber, PA-C    Family History Family History  Problem Relation Age of Onset  . Stroke Maternal Uncle   . Hypertension Paternal Aunt   . Diabetes Paternal Aunt     Social History Social History   Tobacco Use  . Smoking status: Never Smoker  . Smokeless tobacco: Never Used  Substance Use Topics  . Alcohol use: Yes  . Drug use: No     Allergies   Patient has no known allergies.   Review of Systems Review of Systems  Constitutional: Negative for appetite change.  HENT: Negative for congestion.   Respiratory: Negative for shortness of breath.   Cardiovascular: Negative for chest pain.  Gastrointestinal: Positive for abdominal pain.  Genitourinary: Positive for vaginal bleeding. Negative for flank pain.  Musculoskeletal: Negative for back pain.  Skin: Negative for rash.  Neurological: Negative for weakness.  Psychiatric/Behavioral: Negative for confusion.     Physical Exam Updated Vital Signs BP 119/61 (BP  Location: Right Arm)   Pulse 83   Temp 99.3 F (37.4 C) (Oral)   Resp 16   SpO2 100%   Physical Exam Vitals signs and nursing note reviewed.  HENT:     Head: Normocephalic.  Eyes:     Pupils: Pupils are equal, round, and reactive to light.  Neck:     Musculoskeletal: Neck supple.  Cardiovascular:     Rate and Rhythm: Normal rate and regular rhythm.  Pulmonary:     Breath sounds: No wheezing or rhonchi.  Abdominal:     Tenderness: There is abdominal tenderness.     Comments: Suprapubic mass with some tenderness.  Had crampy pain while I was watching.  Genitourinary:    Comments: Was likely less than fingertip pelvic exam showed osteitis likely less than fingertip.  Mild bleeding. Musculoskeletal:     Right lower leg: No edema.     Left lower leg: No edema.  Skin:    Capillary Refill: Capillary refill takes less than 2 seconds.     Coloration: Skin is not jaundiced.  Neurological:      General: No focal deficit present.     Mental Status: She is alert.      ED Treatments / Results  Labs (all labs ordered are listed, but only abnormal results are displayed) Labs Reviewed  CBC WITH DIFFERENTIAL/PLATELET - Abnormal; Notable for the following components:      Result Value   Hemoglobin 10.6 (*)    HCT 35.2 (*)    MCH 25.2 (*)    All other components within normal limits  BASIC METABOLIC PANEL  URINALYSIS, ROUTINE W REFLEX MICROSCOPIC    EKG None  Radiology US Ob Comp + 14 Wk  Result Date: 05/29/2018 CLINICAL DATA:  Vaginal bleeding and cramping. Estimated gestational age [redacted] weeks per LMP. EXAM: LIMITED OBSTETRIC ULTRASOUND FINDINGS: Exam was very difficult to evaluate transabdominal due to significant decreased amniotic fluid. Multiple uterine fibroids also limiting visualization. Patient declined completion of the vaginal ultrasound exam due to pain as exam had to be terminated. Number of Fetuses: 1 Heart Rate:  Not evaluated bpm Movement: No. Presentation: Cephalic into lower uterine segment. Placental Location: Not evaluated. Previa: No. Amniotic Fluid (Subjective):  Subjectively decreased. AFI: Not evaluated.  Cm BPD: Not evaluated.  Cm  w   d MATERNAL FINDINGS: Cervix:  Open with small amount of fluid within endocervical canal. Uterus/Adnexae: Not evaluated. IMPRESSION: Incomplete exam as patient declined completion of the exam due to pain. Single IUP was visualized in cephalic presentation with significant decreased amniotic fluid. Recommend completion of the transvaginal pelvic ultrasound when feasible. This exam is performed on an emergent basis and does not comprehensively evaluate fetal size, dating, or anatomy; follow-up complete OB US should be considered if further fetal assessment is warranted. Electronically Signed   By: Elberta Fortis M.D.   On: 05/29/2018 21:53    Procedures Procedures (including critical care time)  Medications Ordered in ED Medications   fentaNYL (SUBLIMAZE) injection 50 mcg (has no administration in time range)  sodium chloride 0.9 % bolus 1,000 mL (0 mLs Intravenous Stopped 05/29/18 2223)     Initial Impression / Assessment and Plan / ED Course  I have reviewed the triage vital signs and the nursing notes.  Pertinent labs & imaging results that were available during my care of the patient were reviewed by me and considered in my medical decision making (see chart for details).       Patient presents  with vaginal bleeding.  Crampy pain feels like contractions.  Hemoglobin reassuring.  Patient is a positive blood.  Initial ultrasound could not find fetal heartbeat but states it was unable to measure.  Discussed with Dr. Dennie Maizes [phone] at [facility].  We will repeat the ultrasound and attempt to get a better look for fetal demise.  Pain medicine given.  Patient may require D&C or closer follow-up.  Could go to [facility] or [facility] here if needed.  Care turned over to Dr. Read Drivers   Final Clinical Impressions(s) / ED Diagnoses   Final diagnoses:  Vaginal bleeding in pregnancy    ED Discharge Orders    None       Benjiman Core, MD 05/29/18 2250

## 2018-05-29 NOTE — ED Provider Notes (Signed)
Discussed with Sandria Bales of radiology.  It is her belief that this represents an intrauterine fetal demise but all of the usual criteria cannot be met due to limited study.  The patient would prefer to follow-up with her OB/GYN at [facility] tomorrow.  She was advised that should she develop severe bleeding or pain she should go to the maternity admissions unit it for [facility] on the [place].     Kista Robb, Jenny Reichmann, MD 05/29/18 [phone]

## 2018-05-29 NOTE — ED Notes (Signed)
Pt asked to wait for pain meds until [place] being done. Pt in NAD.

## 2018-05-29 NOTE — ED Triage Notes (Signed)
Patient c/o abdominal cramping today and vaginal bleeding x1 hour. Reports she is [redacted] weeks pregnant.

## 2018-05-29 NOTE — ED Notes (Signed)
Pt resting in bed. Pelvic exam done with provider. Pt ambulating to bathroom. NAD

## 2020-05-22 IMAGING — US OBSTETRIC 14+ WK ULTRASOUND
1 series · 13 of 14 positions shown · non-contrast
Comparison: none

CLINICAL DATA: Vaginal bleeding and cramping. Estimated gestational
age 14 weeks per LMP.

EXAM:
LIMITED OBSTETRIC ULTRASOUND

[Series 1: obstetric 14+ wk ultrasound · 14 acquisitions, 13 frames shown]
[im 1/14]
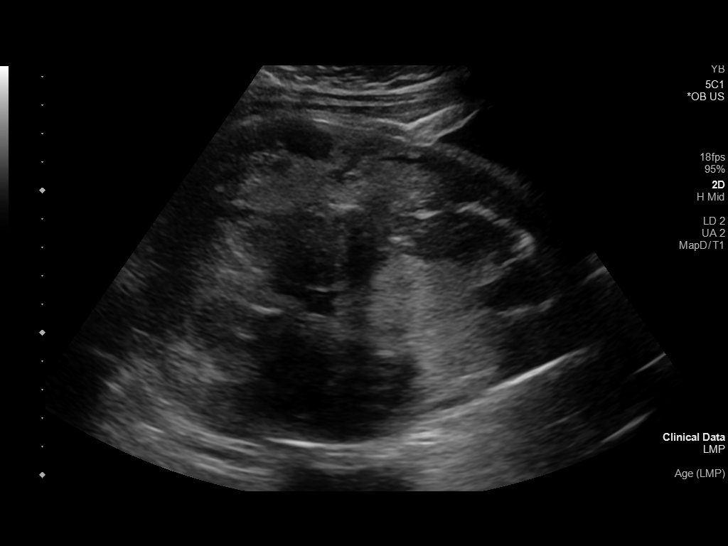
[im 2/14]
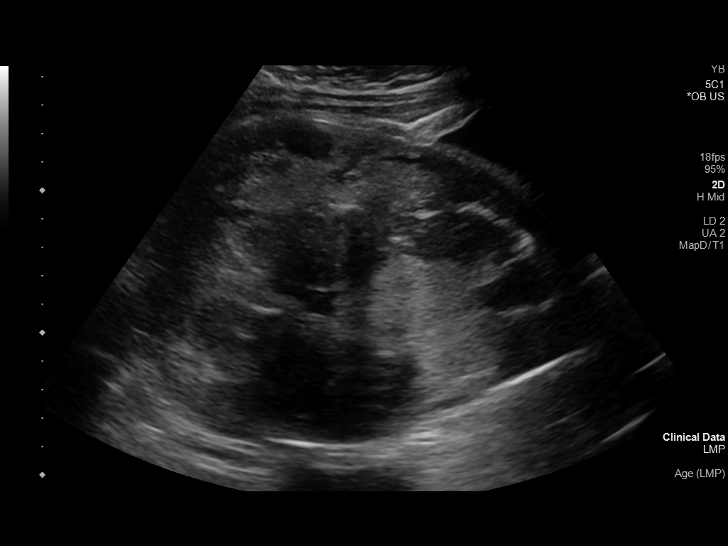
[im 3/14]
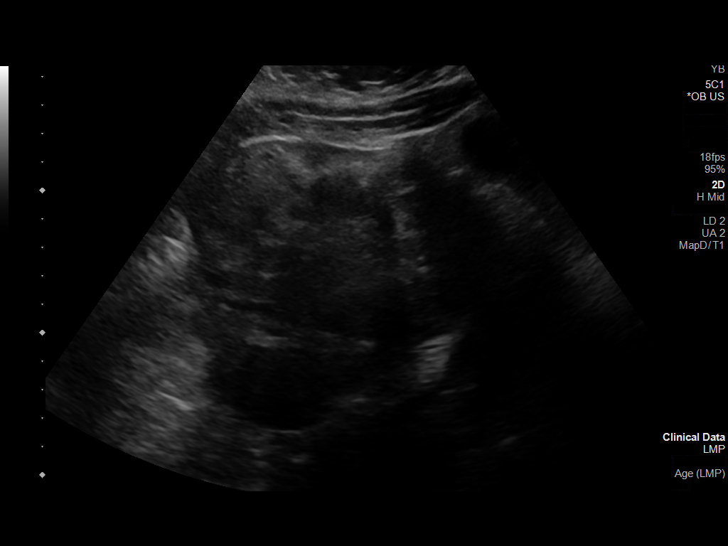
[im 4/14]
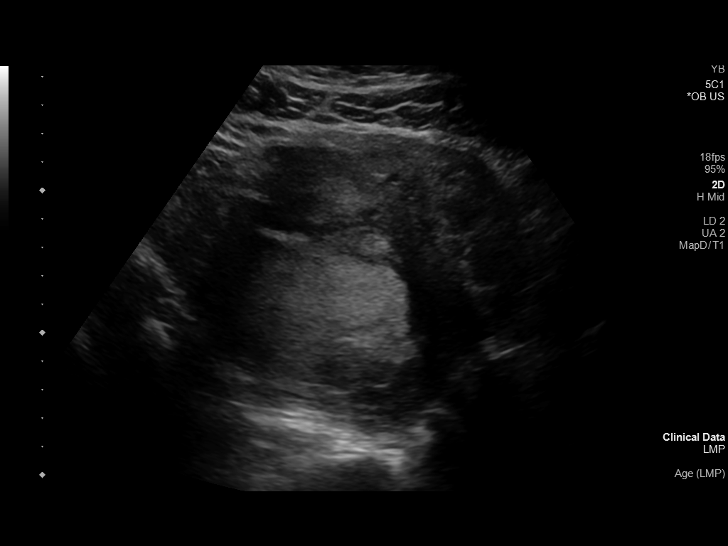
[im 5/14]
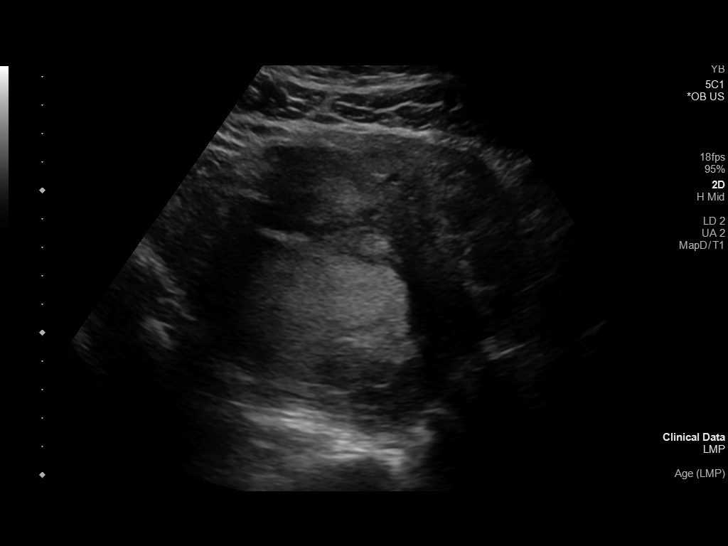
[im 6/14]
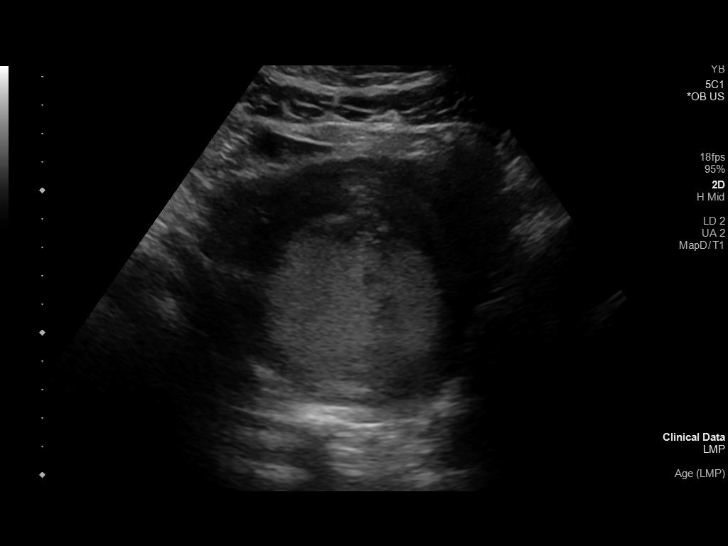
[im 8/14]
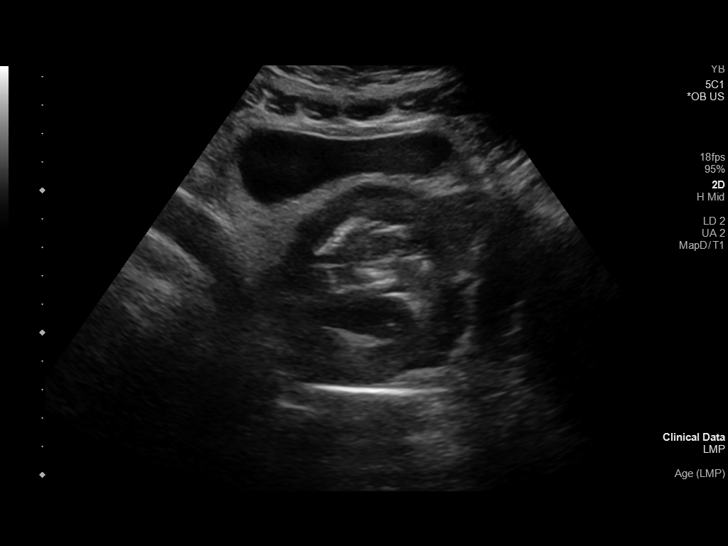
[im 9/14]
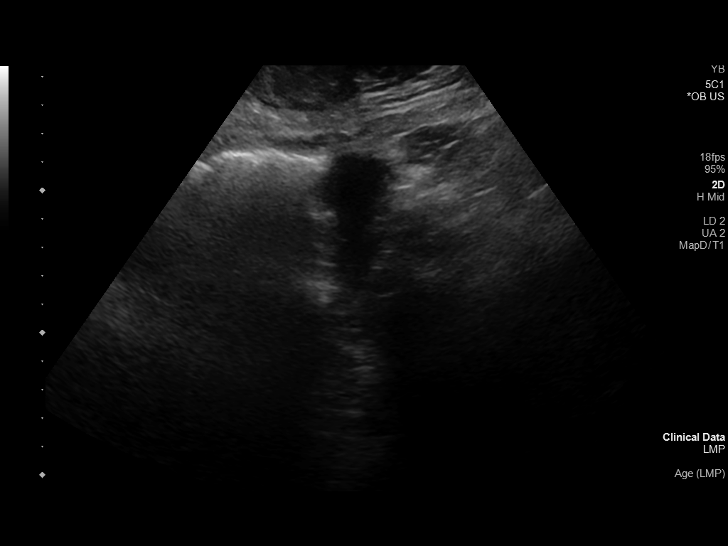
[im 10/14]
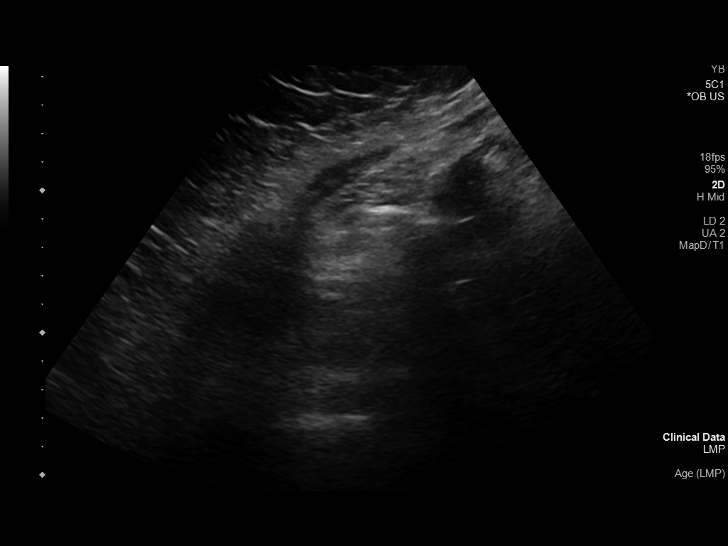
[im 11/14]
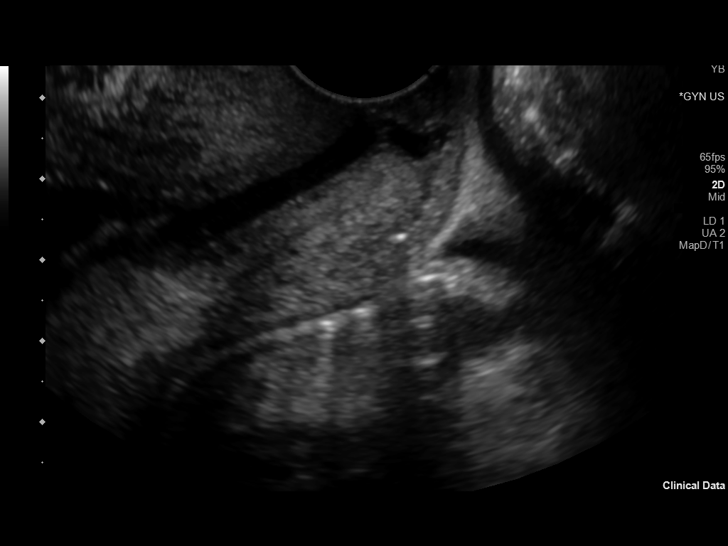
[im 12/14]
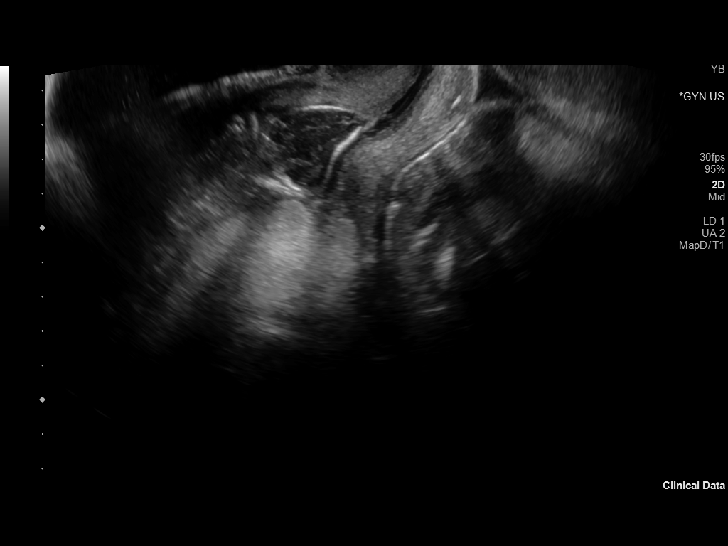
[im 13/14]
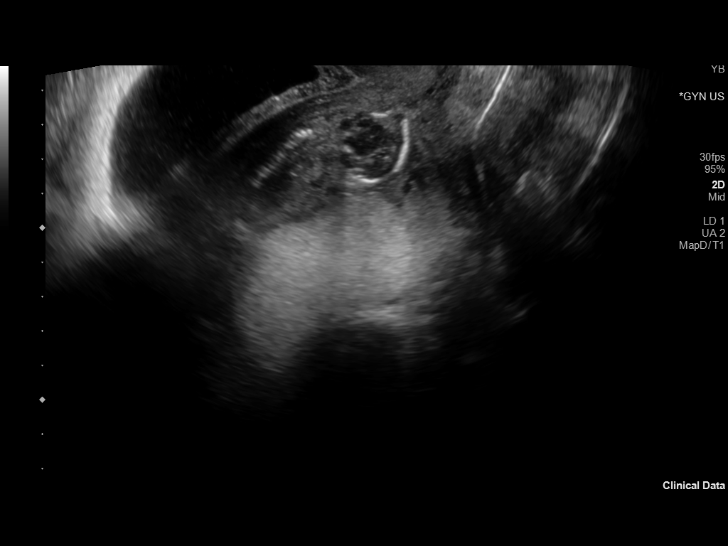
[im 14/14]
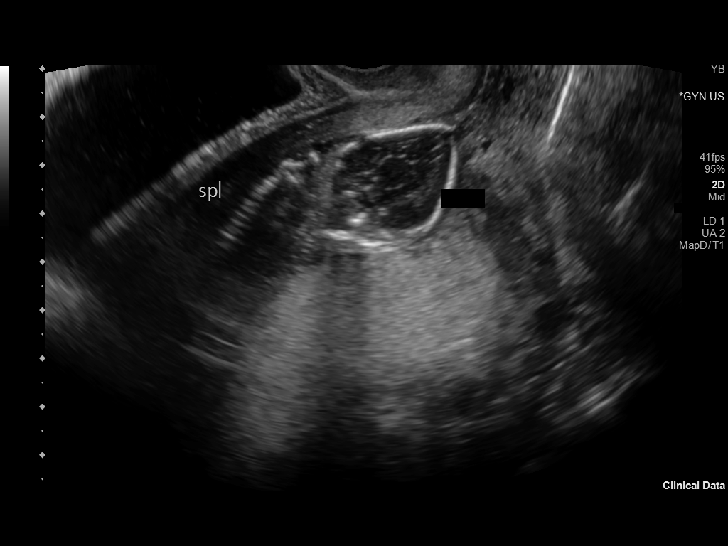

[13 of 14 positions shown; findings below may reference images not displayed]

FINDINGS: Exam was very difficult to evaluate transabdominal due to
significant decreased amniotic fluid. Multiple uterine fibroids also
limiting visualization. Patient declined completion of the vaginal
ultrasound exam due to pain as exam had to be terminated.

Number of Fetuses: 1

Heart Rate:  Not evaluated bpm

Movement: No.

Presentation: Cephalic into lower uterine segment.

Placental Location: Not evaluated.

Previa: No.

Amniotic Fluid (Subjective):  Subjectively decreased.

AFI: Not evaluated.  Cm

BPD: Not evaluated.  Cm  w   d

MATERNAL FINDINGS:

Cervix:  Open with small amount of fluid within endocervical canal.

Uterus/Adnexae: Not evaluated.
IMPRESSION: Incomplete exam as patient declined completion of the exam due to
pain. Single IUP was visualized in cephalic presentation with
significant decreased amniotic fluid. Recommend completion of the
transvaginal pelvic ultrasound when feasible.

This exam is performed on an emergent basis and does not
comprehensively evaluate fetal size, dating, or anatomy; follow-up
complete OB US should be considered if further fetal assessment is
warranted.
